# Patient Record
Sex: Female | Born: 2000 | Race: White | Hispanic: No | Marital: Single | Smoking: Never smoker
Health system: Southern US, Community
[De-identification: ages and names within clinical notes are randomized; demographics above are authoritative.]

## PROBLEM LIST (undated history)

## (undated) DIAGNOSIS — L709 Acne, unspecified: Secondary | ICD-10-CM

## (undated) HISTORY — DX: Acne, unspecified: L70.9

## (undated) HISTORY — PX: NO PAST SURGERIES: SHX2092

---
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
## 2021-09-13 ENCOUNTER — Emergency Department
Admission: EM | Admit: 2021-09-13 | Discharge: 2021-09-13 | Disposition: A | Discharge status: Home / Self Care | Payer: BC Managed Care – PPO | Attending: Emergency Medicine | Admitting: Emergency Medicine

## 2021-09-13 ENCOUNTER — Encounter: Payer: Self-pay | Admitting: Emergency Medicine

## 2021-09-13 ENCOUNTER — Other Ambulatory Visit: Payer: Self-pay

## 2021-09-13 DIAGNOSIS — T782XXA Anaphylactic shock, unspecified, initial encounter: Secondary | ICD-10-CM | POA: Insufficient documentation

## 2021-09-13 DIAGNOSIS — T7840XA Allergy, unspecified, initial encounter: Secondary | ICD-10-CM | POA: Diagnosis present

## 2021-09-13 MED ORDER — FAMOTIDINE 20 MG PO TABS
20.0000 mg | ORAL_TABLET | Freq: Once | ORAL | Status: AC
  Administered 2021-09-13: 20 mg via ORAL
  Filled 2021-09-13: qty 1

## 2021-09-13 MED ORDER — EPINEPHRINE 0.3 MG/0.3ML IJ SOAJ: 0.3000 mg | INTRAMUSCULAR | 1 refills | Status: AC | PRN

## 2021-09-13 MED ORDER — PREDNISONE 20 MG PO TABS
60.0000 mg | ORAL_TABLET | Freq: Once | ORAL | Status: AC
  Administered 2021-09-13: 60 mg via ORAL
  Filled 2021-09-13: qty 3

## 2021-09-13 NOTE — ED Notes (Signed)
Dc instructions and scripts reviewed with pt no questions or concerns at this time. Will follow up with pcp.  

## 2021-09-13 NOTE — ED Provider Triage Note (Signed)
Emergency Medicine Provider Triage Evaluation Note  Mackenzie Herrera , a 21 y.o. female  was evaluated in triage.  Pt complains of allergic reaction.  Was at work at [employer].  Her lower lip started to swell.  She took a Zyrtec and her inhaler.  Had no difficulty breathing.  However at the vet clinic gave her a shot of her EpiPen.  Chest pain or shortness of breath.  Review of Systems  Positive:  Negative:   Physical Exam  BP (!) 156/89 (BP Location: Left Arm)   Pulse (!) 122   Temp 99.6 F (37.6 C) (Oral)   Resp 18   Ht 5\' 4"  (1.626 m)   Wt 124.7 kg   SpO2 96%   BMI 47.20 kg/m  Gen:   Awake, no distress   Resp:  Normal effort  MSK:   Moves extremities without difficulty  Other:    Medical Decision Making  Medically screening exam initiated at 2:13 PM.  Appropriate orders placed.  Mackenzie Herrera was informed that the remainder of the evaluation will be completed by another provider, this initial triage assessment does not replace that evaluation, and the importance of remaining in the ED until their evaluation is complete.  No swelling noted, patient appears well.  We will have to monitor secondary to being given the EpiPen.  Nursing staff instructed to get EKG   Mayford Knife, PA-C 09/13/21 1414

## 2021-09-13 NOTE — ED Provider Notes (Signed)
[facility] Provider Note    Event Date/Time   First MD Initiated Contact with Patient 09/13/21 1543     (approximate)   History   Chief Complaint Allergic Reaction   HPI  Mackenzie Herrera is a 21 y.o. female with no significant past medical history presents to the ED complaining of allergic reaction.  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  She had just eaten lunch, but denies any exposure to new foods or new medications.  She denies any associated rash, nausea, vomiting, difficulty breathing, or lightheadedness.  She reports taking Zyrtec without significant improvement, EMS was called and administered EpiPen around 1:30 PM.  She was been brought to the ED for further evaluation.  Patient states that the lip swelling has improved, denies any other symptoms at this time.  She reports a history of similar reactions in the past.     Physical Exam   Triage Vital Signs: ED Triage Vitals [09/13/21 1406]  Enc Vitals Group     BP (!) 156/89     Pulse Rate (!) 122     Resp 18     Temp 99.6 F (37.6 C)     Temp Source Oral     SpO2 96 %     Weight 275 lb (124.7 kg)     Height 5\' 4"  (1.626 m)     Head Circumference      Peak Flow      Pain Score 0     Pain Loc      Pain Edu?      Excl. in GC?     Most recent vital signs: Vitals:   09/13/21 1406  BP: (!) 156/89  Pulse: (!) 122  Resp: 18  Temp: 99.6 F (37.6 C)  SpO2: 96%    Constitutional: Alert and oriented. Eyes: Conjunctivae are normal. Head: Atraumatic. Nose: No congestion/rhinnorhea. Mouth/Throat: Mucous membranes are moist.  No oropharyngeal edema. Cardiovascular: Normal rate, regular rhythm. Grossly normal heart sounds.  2+ radial pulses bilaterally. Respiratory: Normal respiratory effort.  No retractions. Lungs CTAB. Gastrointestinal: Soft and nontender. No  distention. Musculoskeletal: No lower extremity tenderness nor edema.  Neurologic:  Normal speech and language. No gross focal neurologic deficits are appreciated.    ED Results / Procedures / Treatments   Labs (all labs ordered are listed, but only abnormal results are displayed) Labs Reviewed - No data to display   EKG  ED ECG REPORT I, 11/13/21, the attending physician, personally viewed and interpreted this ECG.   Date: 09/13/2021  EKG Time: 14:15  Rate: 105  Rhythm: sinus tachycardia  Axis: Normal  Intervals:none  ST&T Change: None  PROCEDURES:  Critical Care performed: No  Procedures   MEDICATIONS ORDERED IN ED: Medications  famotidine (PEPCID) tablet 20 mg (20 mg Oral Given 09/13/21 1604)  predniSONE (DELTASONE) tablet 60 mg (60 mg Oral Given 09/13/21 1604)     IMPRESSION / MDM / ASSESSMENT AND PLAN / ED COURSE  I reviewed the triage vital signs and the nursing notes.                              21 y.o. female with no significant past medical history who presents to the ED complaining of facial swelling and itching starting acutely earlier today.  Patient's presentation  is most consistent with acute presentation with potential threat to life or bodily function.  Differential diagnosis includes, but is not limited to, anaphylaxis, allergic reaction, angioedema.  Patient well-appearing and in no acute distress, vital signs remarkable for tachycardia likely secondary to epinephrine administration.  Symptoms related to allergic reaction seem to have resolved, we will administer prednisone and Pepcid to guard against recurrent anaphylactic reaction.  Plan to observe until 4 hours from time patient was given epinephrine.  Patient has been observed for greater than 4 hours since epinephrine administration with no recurrent symptoms.  She is appropriate for discharge home with follow-up as needed, was counseled to return to the ED for new or worsening symptoms.   She will be provided with a refill for her EpiPen, patient agrees with plan.      FINAL CLINICAL IMPRESSION(S) / ED DIAGNOSES   Final diagnoses:  Anaphylaxis, initial encounter     Rx / DC Orders   ED Discharge Orders          Ordered    EPINEPHrine 0.3 mg/0.3 mL IJ SOAJ injection  As needed        09/13/21 1730             Note:  This document was prepared using Dragon voice recognition software and may include unintentional dictation errors.   Chesley Noon, MD 09/13/21 [phone]

## 2021-09-13 NOTE — ED Triage Notes (Signed)
Patient to ED for allergic reaction at work- [employer] Patient unsure what she was reacting to. Patient states lips started to swell- no new foods. Patient took zyrtec and Vet gave epi pen at 1319. Patient denies difficulty breathing.
